# Patient Record
Sex: Male | Born: 1955 | Race: Asian | Hispanic: No | Marital: Married | Smoking: Never smoker
Health system: Southern US, Community
[De-identification: ages and names within clinical notes are randomized; demographics above are authoritative.]

## PROBLEM LIST (undated history)

## (undated) HISTORY — PX: HERNIA REPAIR: SHX51

---

## 2007-02-16 ENCOUNTER — Ambulatory Visit: Payer: Self-pay | Admitting: Internal Medicine

## 2015-04-23 ENCOUNTER — Ambulatory Visit
Admission: EM | Admit: 2015-04-23 | Discharge: 2015-04-23 | Disposition: A | Discharge status: Home / Self Care | Payer: BC Managed Care – PPO | Attending: Family Medicine | Admitting: Family Medicine

## 2015-04-23 ENCOUNTER — Encounter: Payer: Self-pay | Admitting: *Deleted

## 2015-04-23 DIAGNOSIS — J0100 Acute maxillary sinusitis, unspecified: Secondary | ICD-10-CM | POA: Diagnosis not present

## 2015-04-23 LAB — RAPID INFLUENZA A&B ANTIGENS
Influenza A (ARMC): NEGATIVE
Influenza B (ARMC): NEGATIVE

## 2015-04-23 LAB — RAPID STREP SCREEN (MED CTR MEBANE ONLY): Streptococcus, Group A Screen (Direct): NEGATIVE

## 2015-04-23 MED ORDER — AMOXICILLIN 875 MG PO TABS: 875.0000 mg | ORAL_TABLET | Freq: Two times a day (BID) | ORAL | Status: DC

## 2015-04-23 MED ORDER — GUAIFENESIN-CODEINE 100-10 MG/5ML PO SOLN: ORAL | Status: DC

## 2015-04-23 NOTE — ED Provider Notes (Signed)
CSN: [id number]     Arrival date & time 04/23/15  0831 History   First MD Initiated Contact with Patient 04/23/15 [id number]     Chief Complaint  Patient presents with  . Cough  . Fever  . Sore Throat  . Headache  . Generalized Body Aches   (Consider location/radiation/quality/duration/timing/severity/associated sxs/prior Treatment) Patient is a 60 y.o. male presenting with URI. The history is provided by the patient.  URI Presenting symptoms: congestion, cough, facial pain, fatigue, fever and sore throat   Severity:  Moderate Onset quality:  Sudden Duration:  1 week Timing:  Constant Progression:  Worsening Chronicity:  New Relieved by:  Nothing Ineffective treatments:  OTC medications Associated symptoms: headaches and sinus pain   Associated symptoms: no wheezing   Risk factors: sick contacts   Risk factors: not elderly, no chronic cardiac disease, no chronic kidney disease, no chronic respiratory disease, no diabetes mellitus, no immunosuppression, no recent illness and no recent travel     History reviewed. No pertinent past medical history. History reviewed. No pertinent past surgical history. History reviewed. No pertinent family history. Social History  Substance Use Topics  . Smoking status: Never Smoker   . Smokeless tobacco: None  . Alcohol Use: No    Review of Systems  Constitutional: Positive for fever and fatigue.  HENT: Positive for congestion and sore throat.   Respiratory: Positive for cough. Negative for wheezing.   Neurological: Positive for headaches.    Allergies  Review of patient's allergies indicates no known allergies.  Home Medications   Prior to Admission medications   Medication Sig Start Date End Date Taking? Authorizing Provider  aspirin 325 MG tablet Take 325 mg by mouth daily.   Yes Historical Provider, MD  guaiFENesin (MUCINEX) 600 MG 12 hr tablet Take by mouth 2 (two) times daily.   Yes Historical Provider, MD  amoxicillin (AMOXIL) 875  MG tablet Take 1 tablet (875 mg total) by mouth 2 (two) times daily. 04/23/15   Payton Mccallum, MD  guaiFENesin-codeine 100-10 MG/5ML syrup 5-10 ml po qhs prn cough 04/23/15   Payton Mccallum, MD   Meds Ordered and Administered this Visit  Medications - No data to display  BP 112/70 mmHg  Pulse 74  Temp(Src) 98.2 F (36.8 C) (Oral)  Resp 16  Ht  (1.676 m)  Wt 150 lb (68.04 kg)  BMI 24.22 kg/m2  SpO2 96% No data found.   Physical Exam  Constitutional: He appears well-developed and well-nourished. No distress.  HENT:  Head: Normocephalic and atraumatic.  Right Ear: Tympanic membrane, external ear and ear canal normal.  Left Ear: Tympanic membrane, external ear and ear canal normal.  Nose: Right sinus exhibits maxillary sinus tenderness and frontal sinus tenderness. Left sinus exhibits maxillary sinus tenderness and frontal sinus tenderness.  Mouth/Throat: Uvula is midline, oropharynx is clear and moist and mucous membranes are normal. No oropharyngeal exudate or tonsillar abscesses.  Eyes: Conjunctivae and EOM are normal. Pupils are equal, round, and reactive to light. Right eye exhibits no discharge. Left eye exhibits no discharge. No scleral icterus.  Neck: Normal range of motion. Neck supple. No tracheal deviation present. No thyromegaly present.  Cardiovascular: Normal rate, regular rhythm and normal heart sounds.   Pulmonary/Chest: Effort normal and breath sounds normal. No stridor. No respiratory distress. He has no wheezes. He has no rales. He exhibits no tenderness.  Lymphadenopathy:    He has no cervical adenopathy.  Neurological: He is alert.  Skin: Skin is warm  and dry. No rash noted. He is not diaphoretic.  Nursing note and vitals reviewed.   ED Course  Procedures (including critical care time)  Labs Review Labs Reviewed  RAPID INFLUENZA A&B ANTIGENS ([facility] ONLY)  RAPID STREP SCREEN (NOT [facility])  CULTURE, GROUP A STREP [facility])    Imaging Review No results  found.   Visual Acuity Review  Right Eye Distance:   Left Eye Distance:   Bilateral Distance:    Right Eye Near:   Left Eye Near:    Bilateral Near:         MDM   1. Acute maxillary sinusitis, recurrence not specified    New Prescriptions   AMOXICILLIN (AMOXIL) 875 MG TABLET    Take 1 tablet (875 mg total) by mouth 2 (two) times daily.   GUAIFENESIN-CODEINE 100-10 MG/5ML SYRUP    5-10 ml po qhs prn cough   1. Lab results and diagnosis reviewed with patient 2. rx as per orders above; reviewed possible side effects, interactions, risks and benefits  3. Recommend supportive treatment with increased fluids, otc flonase 4. Follow-up prn if symptoms worsen or don't improve    Payton Mccallumrlando Josmar Messimer, MD 04/23/15 [phone]

## 2015-04-23 NOTE — ED Notes (Signed)
Non-productive cough, fever, sore throat, headache, and body aches x1 month.

## 2015-04-25 LAB — CULTURE, GROUP A STREP (THRC)

## 2015-06-18 ENCOUNTER — Ambulatory Visit
Admission: EM | Admit: 2015-06-18 | Discharge: 2015-06-18 | Disposition: A | Discharge status: Home / Self Care | Payer: BC Managed Care – PPO | Attending: Family Medicine | Admitting: Family Medicine

## 2015-06-18 ENCOUNTER — Encounter: Payer: Self-pay | Admitting: *Deleted

## 2015-06-18 DIAGNOSIS — A09 Infectious gastroenteritis and colitis, unspecified: Secondary | ICD-10-CM

## 2015-06-18 DIAGNOSIS — R197 Diarrhea, unspecified: Secondary | ICD-10-CM

## 2015-06-18 LAB — CBC WITH DIFFERENTIAL/PLATELET
Basophils Absolute: 0 10*3/uL (ref 0–0.1)
Basophils Relative: 0 %
Eosinophils Absolute: 0.2 10*3/uL (ref 0–0.7)
Eosinophils Relative: 2 %
HCT: 38.1 % — ABNORMAL LOW (ref 40.0–52.0)
Hemoglobin: 12.5 g/dL — ABNORMAL LOW (ref 13.0–18.0)
Lymphocytes Relative: 11 %
Lymphs Abs: 1.1 10*3/uL (ref 1.0–3.6)
MCH: 27.2 pg (ref 26.0–34.0)
MCHC: 32.7 g/dL (ref 32.0–36.0)
MCV: 83.1 fL (ref 80.0–100.0)
Monocytes Absolute: 0.5 10*3/uL (ref 0.2–1.0)
Monocytes Relative: 5 %
Neutro Abs: 8.1 10*3/uL — ABNORMAL HIGH (ref 1.4–6.5)
Neutrophils Relative %: 82 %
Platelets: 355 10*3/uL (ref 150–440)
RBC: 4.59 MIL/uL (ref 4.40–5.90)
RDW: 14.6 % — ABNORMAL HIGH (ref 11.5–14.5)
WBC: 9.8 10*3/uL (ref 3.8–10.6)

## 2015-06-18 LAB — COMPREHENSIVE METABOLIC PANEL
ALT: 17 U/L (ref 17–63)
AST: 23 U/L (ref 15–41)
Albumin: 4 g/dL (ref 3.5–5.0)
Alkaline Phosphatase: 91 U/L (ref 38–126)
Anion gap: 10 (ref 5–15)
BUN: 19 mg/dL (ref 6–20)
CO2: 24 mmol/L (ref 22–32)
Calcium: 8.8 mg/dL — ABNORMAL LOW (ref 8.9–10.3)
Chloride: 105 mmol/L (ref 101–111)
Creatinine, Ser: 1.09 mg/dL (ref 0.61–1.24)
GFR calc Af Amer: >60 mL/min (ref >60)
GFR calc non Af Amer: >60 mL/min (ref >60)
Glucose, Bld: 99 mg/dL (ref 65–99)
Potassium: 3.2 mmol/L — ABNORMAL LOW (ref 3.5–5.1)
Sodium: 139 mmol/L (ref 135–145)
Total Bilirubin: 1.2 mg/dL (ref 0.3–1.2)
Total Protein: 7.9 g/dL (ref 6.5–8.1)

## 2015-06-18 MED ORDER — CIPROFLOXACIN HCL 500 MG PO TABS: 500.0000 mg | ORAL_TABLET | Freq: Two times a day (BID) | ORAL | Status: DC

## 2015-06-18 MED ORDER — ONDANSETRON 8 MG PO TBDP: 8.0000 mg | ORAL_TABLET | Freq: Two times a day (BID) | ORAL | Status: DC

## 2015-06-18 NOTE — ED Notes (Signed)
Pt feels he ate questionable food last Saturday and since has had persistent N/V/D and cannot "keep anything down". Also non-productive cough.

## 2015-06-18 NOTE — Discharge Instructions (Signed)
Diarrhea Diarrhea is frequent loose and watery bowel movements. It can cause you to feel weak and dehydrated. Dehydration can cause you to become tired and thirsty, have a dry mouth, and have decreased urination that often is dark yellow. Diarrhea is a sign of another problem, most often an infection that will not last long. In most cases, diarrhea typically lasts 2-3 days. However, it can last longer if it is a sign of something more serious. It is important to treat your diarrhea as directed by your caregiver to lessen or prevent future episodes of diarrhea. CAUSES  Some common causes include:  Gastrointestinal infections caused by viruses, bacteria, or parasites.  Food poisoning or food allergies.  Certain medicines, such as antibiotics, chemotherapy, and laxatives.  Artificial sweeteners and fructose.  Digestive disorders. HOME CARE INSTRUCTIONS  Ensure adequate fluid intake (hydration): Have 1 cup (8 oz) of fluid for each diarrhea episode. Avoid fluids that contain simple sugars or sports drinks, fruit juices, whole milk products, and sodas. Your urine should be clear or pale yellow if you are drinking enough fluids. Hydrate with an oral rehydration solution that you can purchase at pharmacies, retail stores, and online. You can prepare an oral rehydration solution at home by mixing the following ingredients together:   - tsp table salt.   tsp baking soda.   tsp salt substitute containing potassium chloride.  1  tablespoons sugar.  1 L (34 oz) of water.  Certain foods and beverages may increase the speed at which food moves through the gastrointestinal (GI) tract. These foods and beverages should be avoided and include:  Caffeinated and alcoholic beverages.  High-fiber foods, such as raw fruits and vegetables, nuts, seeds, and whole grain breads and cereals.  Foods and beverages sweetened with sugar alcohols, such as xylitol, sorbitol, and mannitol.  Some foods may be well  tolerated and may help thicken stool including:  Starchy foods, such as rice, toast, pasta, low-sugar cereal, oatmeal, grits, baked potatoes, crackers, and bagels.  Bananas.  Applesauce.  Add probiotic-rich foods to help increase healthy bacteria in the GI tract, such as yogurt and fermented milk products.  Wash your hands well after each diarrhea episode.  Only take over-the-counter or prescription medicines as directed by your caregiver.  Take a warm bath to relieve any burning or pain from frequent diarrhea episodes. SEEK IMMEDIATE MEDICAL CARE IF:   You are unable to keep fluids down.  You have persistent vomiting.  You have blood in your stool, or your stools are black and tarry.  You do not urinate in 6-8 hours, or there is only a small amount of very dark urine.  You have abdominal pain that increases or localizes.  You have weakness, dizziness, confusion, or light-headedness.  You have a severe headache.  Your diarrhea gets worse or does not get better.  You have a fever or persistent symptoms for more than 2-3 days.  You have a fever and your symptoms suddenly get worse. MAKE SURE YOU:   Understand these instructions.  Will watch your condition.  Will get help right away if you are not doing well or get worse.   This information is not intended to replace advice given to you by your health care provider. Make sure you discuss any questions you have with your health care provider.   Document Released: 12/23/2001 Document Revised: 01/23/2014 Document Reviewed: 09/10/2011 Elsevier Interactive Patient Education 2016 [employer]   Food Choices to Help Relieve Diarrhea, Adult When you have   diarrhea, the foods you eat and your eating habits are very important. Choosing the right foods and drinks can help relieve diarrhea. Also, because diarrhea can last up to 7 days, you need to replace lost fluids and electrolytes (such as sodium, potassium, and chloride) in  order to help prevent dehydration.  WHAT GENERAL GUIDELINES DO I NEED TO FOLLOW?  Slowly drink 1 cup (8 oz) of fluid for each episode of diarrhea. If you are getting enough fluid, your urine will be clear or pale yellow.  Eat starchy foods. Some good choices include white rice, white toast, pasta, low-fiber cereal, baked potatoes (without the skin), saltine crackers, and bagels.  Avoid large servings of any cooked vegetables.  Limit fruit to two servings per day. A serving is  cup or 1 small piece.  Choose foods with less than 2 g of fiber per serving.  Limit fats to less than 8 tsp (38 g) per day.  Avoid fried foods.  Eat foods that have probiotics in them. Probiotics can be found in certain dairy products.  Avoid foods and beverages that may increase the speed at which food moves through the stomach and intestines (gastrointestinal tract). Things to avoid include:  High-fiber foods, such as dried fruit, raw fruits and vegetables, nuts, seeds, and whole grain foods.  Spicy foods and high-fat foods.  Foods and beverages sweetened with high-fructose corn syrup, honey, or sugar alcohols such as xylitol, sorbitol, and mannitol. WHAT FOODS ARE RECOMMENDED? Grains White rice. White, French, or pita breads (fresh or toasted), including plain rolls, buns, or bagels. White pasta. Saltine, soda, or graham crackers. Pretzels. Low-fiber cereal. Cooked cereals made with water (such as cornmeal, farina, or cream cereals). Plain muffins. Matzo. Melba toast. Zwieback.  Vegetables Potatoes (without the skin). Strained tomato and vegetable juices. Most well-cooked and canned vegetables without seeds. Tender lettuce. Fruits Cooked or canned applesauce, apricots, cherries, fruit cocktail, grapefruit, peaches, pears, or plums. Fresh bananas, apples without skin, cherries, grapes, cantaloupe, grapefruit, peaches, oranges, or plums.  Meat and Other Protein Products Baked or boiled chicken. Eggs. Tofu.  Fish. Seafood. Smooth peanut butter. Ground or well-cooked tender beef, ham, veal, lamb, pork, or poultry.  Dairy Plain yogurt, kefir, and unsweetened liquid yogurt. Lactose-free milk, buttermilk, or soy milk. Plain hard cheese. Beverages Sport drinks. Clear broths. Diluted fruit juices (except prune). Regular, caffeine-free sodas such as ginger ale. Water. Decaffeinated teas. Oral rehydration solutions. Sugar-free beverages not sweetened with sugar alcohols. Other Bouillon, broth, or soups made from recommended foods.  The items listed above may not be a complete list of recommended foods or beverages. Contact your dietitian for more options. WHAT FOODS ARE NOT RECOMMENDED? Grains Whole grain, whole wheat, bran, or rye breads, rolls, pastas, crackers, and cereals. Wild or brown rice. Cereals that contain more than 2 g of fiber per serving. Corn tortillas or taco shells. Cooked or dry oatmeal. Granola. Popcorn. Vegetables Raw vegetables. Cabbage, broccoli, Brussels sprouts, artichokes, baked beans, beet greens, corn, kale, legumes, peas, sweet potatoes, and yams. Potato skins. Cooked spinach and cabbage. Fruits Dried fruit, including raisins and dates. Raw fruits. Stewed or dried prunes. Fresh apples with skin, apricots, mangoes, pears, raspberries, and strawberries.  Meat and Other Protein Products Chunky peanut butter. Nuts and seeds. Beans and lentils. Bacon.  Dairy High-fat cheeses. Milk, chocolate milk, and beverages made with milk, such as milk shakes. Cream. Ice cream. Sweets and Desserts Sweet rolls, doughnuts, and sweet breads. Pancakes and waffles. Fats and Oils Butter. Cream sauces.   Margarine. Salad oils. Plain salad dressings. Olives. Avocados.  Beverages Caffeinated beverages (such as coffee, tea, soda, or energy drinks). Alcoholic beverages. Fruit juices with pulp. Prune juice. Soft drinks sweetened with high-fructose corn syrup or sugar alcohols. Other Coconut. Hot sauce.  Chili powder. Mayonnaise. Gravy. Cream-based or milk-based soups.  The items listed above may not be a complete list of foods and beverages to avoid. Contact your dietitian for more information. WHAT SHOULD I DO IF I BECOME DEHYDRATED? Diarrhea can sometimes lead to dehydration. Signs of dehydration include dark urine and dry mouth and skin. If you think you are dehydrated, you should rehydrate with an oral rehydration solution. These solutions can be purchased at pharmacies, retail stores, or online.  Drink -1 cup (120-240 mL) of oral rehydration solution each time you have an episode of diarrhea. If drinking this amount makes your diarrhea worse, try drinking smaller amounts more often. For example, drink 1-3 tsp (5-15 mL) every 5-10 minutes.  A general rule for staying hydrated is to drink 1-2 L of fluid per day. Talk to your health care provider about the specific amount you should be drinking each day. Drink enough fluids to keep your urine clear or pale yellow.   This information is not intended to replace advice given to you by your health care provider. Make sure you discuss any questions you have with your health care provider.   Document Released: 03/25/2003 Document Revised: 01/23/2014 Document Reviewed: 11/25/2012 Elsevier Interactive Patient Education 2016 Elsevier Inc.   

## 2015-06-18 NOTE — ED Provider Notes (Signed)
CSN: [id number]     Arrival date & time 06/18/15  1853 History   First MD Initiated Contact with Patient 06/18/15 2119     Chief Complaint  Patient presents with  . Diarrhea  . Nausea  . Emesis  . Cough   (Consider location/radiation/quality/duration/timing/severity/associated sxs/prior Treatment) HPI  This is a 60 year old male who presents with persistent diarrhea for 6 days. He has had nausea and vomiting and states that he is unable to even keep water down. When questioned now he does not have nausea. Is a nonproductive cough that is had for a while and may be related to GERD because it is improved when he sleeps in a semierect a reclining position. He states that he has no blood or mucus in the stools are extremely watery without form. He is having between 5-6 per day. He will occasionally have cramping but the stomach pain is not a predominant feature. He has been using Imodium r at first which did not work and switched to Pepto-Bismol which had slowed it down somewhat but has failed to help recently.He denies any fever or chills. He was concerned that he had a salad which may have precipitated his symptoms but he is unsure.      History reviewed. No pertinent past medical history. History reviewed. No pertinent past surgical history. History reviewed. No pertinent family history. Social History  Substance Use Topics  . Smoking status: Never Smoker   . Smokeless tobacco: None  . Alcohol Use: No    Review of Systems  Constitutional: Positive for appetite change. Negative for fever, chills and fatigue.  Gastrointestinal: Positive for nausea, vomiting, abdominal pain and diarrhea. Negative for blood in stool, abdominal distention and anal bleeding.  All other systems reviewed and are negative.   Allergies  Review of patient's allergies indicates no known allergies.  Home Medications   Prior to Admission medications   Medication Sig Start Date End Date Taking? Authorizing  Provider  aspirin 325 MG tablet Take 325 mg by mouth daily.   Yes Historical Provider, MD  pantoprazole (PROTONIX) 20 MG tablet Take 20 mg by mouth daily.   Yes Historical Provider, MD  amoxicillin (AMOXIL) 875 MG tablet Take 1 tablet (875 mg total) by mouth 2 (two) times daily. 04/23/15   Norval Gable, MD  ciprofloxacin (CIPRO) 500 MG tablet Take 1 tablet (500 mg total) by mouth 2 (two) times daily. 06/18/15   Lorin Picket, PA-C  guaiFENesin (MUCINEX) 600 MG 12 hr tablet Take by mouth 2 (two) times daily.    Historical Provider, MD  guaiFENesin-codeine 100-10 MG/5ML syrup 5-10 ml po qhs prn cough 04/23/15   Norval Gable, MD  ondansetron (ZOFRAN ODT) 8 MG disintegrating tablet Take 1 tablet (8 mg total) by mouth 2 (two) times daily. 06/18/15   Lorin Picket, PA-C   Meds Ordered and Administered this Visit  Medications - No data to display  BP 114/72 mmHg  Pulse 86  Temp(Src) 98.8 F (37.1 C) (Oral)  Resp 16  Ht '5\' 6"'  (1.676 m)  Wt 145 lb (65.772 kg)  BMI 23.41 kg/m2  SpO2 95% No data found.   Physical Exam  ED Course  Procedures (including critical care time)  Labs Review Labs Reviewed  CBC WITH DIFFERENTIAL/PLATELET - Abnormal; Notable for the following:    Hemoglobin 12.5 (*)    HCT 38.1 (*)    RDW 14.6 (*)    Neutro Abs 8.1 (*)    All other components within  normal limits  COMPREHENSIVE METABOLIC PANEL - Abnormal; Notable for the following:    Potassium 3.2 (*)    Calcium 8.8 (*)    All other components within normal limits    Imaging Review No results found.   Visual Acuity Review  Right Eye Distance:   Left Eye Distance:   Bilateral Distance:    Right Eye Near:   Left Eye Near:    Bilateral Near:         MDM   1. Diarrhea of presumed infectious origin   Medications - No data to display Plan: 1. Test/x-ray results and diagnosis reviewed with patient 2. rx as per orders; risks, benefits, potential side effects reviewed with patient 3. Recommend  supportive treatment with Hydration. I recommended broth as a part of his hydration program. I've given him a stool sample kit to collect stool for GI PCR repleted he will return in the morning. In the meantime I will start him empirically on Cipro 500 mg twice a day as he has had the constant diarrhea for a week pending the results of the PCR. Also provide him with Zofran ODT to help with any nausea so that he can continue with his hydration. If he is unable to hydrate adequately he may have to have IV hydration. If he worsens or is not improving he should be seen at the emergency room. I would like him to follow-up with his primary care physician next week 4. F/u prn if symptoms worsen or don't improve    Lorin Picket, PA-C 06/18/15 2242

## 2015-06-19 ENCOUNTER — Other Ambulatory Visit
Admission: RE | Admit: 2015-06-19 | Discharge: 2015-06-19 | Disposition: A | Discharge status: Home / Self Care | Payer: BC Managed Care – PPO | Source: Ambulatory Visit | Attending: Family Medicine | Admitting: Family Medicine

## 2015-06-19 DIAGNOSIS — R197 Diarrhea, unspecified: Secondary | ICD-10-CM | POA: Insufficient documentation

## 2015-06-20 LAB — GASTROINTESTINAL PANEL BY PCR, STOOL (REPLACES STOOL CULTURE)
Adenovirus F40/41: DETECTED — AB
Astrovirus: NOT DETECTED
Campylobacter species: NOT DETECTED
Cryptosporidium: NOT DETECTED
Cyclospora cayetanensis: NOT DETECTED
E. coli O157: NOT DETECTED
Entamoeba histolytica: NOT DETECTED
Enteroaggregative E coli (EAEC): NOT DETECTED
Enteropathogenic E coli (EPEC): NOT DETECTED
Enterotoxigenic E coli (ETEC): NOT DETECTED
Giardia lamblia: NOT DETECTED
Norovirus GI/GII: NOT DETECTED
Plesimonas shigelloides: NOT DETECTED
Rotavirus A: NOT DETECTED
Salmonella species: NOT DETECTED
Sapovirus (I, II, IV, and V): NOT DETECTED
Shiga like toxin producing E coli (STEC): NOT DETECTED
Shigella/Enteroinvasive E coli (EIEC): NOT DETECTED
Vibrio cholerae: NOT DETECTED
Vibrio species: NOT DETECTED
Yersinia enterocolitica: NOT DETECTED

## 2015-07-23 ENCOUNTER — Ambulatory Visit
Admission: RE | Admit: 2015-07-23 | Discharge: 2015-07-23 | Disposition: A | Discharge status: Home / Self Care | Payer: BC Managed Care – PPO | Source: Ambulatory Visit | Attending: Unknown Physician Specialty | Admitting: Unknown Physician Specialty

## 2015-07-23 ENCOUNTER — Other Ambulatory Visit: Payer: Self-pay | Admitting: Unknown Physician Specialty

## 2015-07-23 DIAGNOSIS — R05 Cough: Secondary | ICD-10-CM | POA: Diagnosis not present

## 2015-07-23 DIAGNOSIS — R059 Cough, unspecified: Secondary | ICD-10-CM

## 2015-07-23 DIAGNOSIS — R918 Other nonspecific abnormal finding of lung field: Secondary | ICD-10-CM | POA: Diagnosis not present

## 2015-07-27 ENCOUNTER — Other Ambulatory Visit: Payer: Self-pay | Admitting: Unknown Physician Specialty

## 2015-07-27 DIAGNOSIS — R05 Cough: Secondary | ICD-10-CM

## 2015-07-27 DIAGNOSIS — R059 Cough, unspecified: Secondary | ICD-10-CM

## 2015-08-02 ENCOUNTER — Ambulatory Visit: Payer: BC Managed Care – PPO | Attending: Unknown Physician Specialty

## 2015-08-10 ENCOUNTER — Ambulatory Visit (INDEPENDENT_AMBULATORY_CARE_PROVIDER_SITE_OTHER): Payer: BC Managed Care – PPO | Admitting: Internal Medicine

## 2015-08-10 ENCOUNTER — Encounter: Payer: Self-pay | Admitting: Internal Medicine

## 2015-08-10 ENCOUNTER — Encounter (INDEPENDENT_AMBULATORY_CARE_PROVIDER_SITE_OTHER): Payer: Self-pay

## 2015-08-10 VITALS — BP 118/64 | HR 79 | Ht 67.0 in | Wt 150.4 lb

## 2015-08-10 DIAGNOSIS — R053 Chronic cough: Secondary | ICD-10-CM | POA: Insufficient documentation

## 2015-08-10 DIAGNOSIS — R938 Abnormal findings on diagnostic imaging of other specified body structures: Secondary | ICD-10-CM

## 2015-08-10 DIAGNOSIS — R9389 Abnormal findings on diagnostic imaging of other specified body structures: Secondary | ICD-10-CM | POA: Insufficient documentation

## 2015-08-10 DIAGNOSIS — R918 Other nonspecific abnormal finding of lung field: Secondary | ICD-10-CM | POA: Insufficient documentation

## 2015-08-10 DIAGNOSIS — R05 Cough: Secondary | ICD-10-CM | POA: Diagnosis not present

## 2015-08-10 DIAGNOSIS — J984 Other disorders of lung: Secondary | ICD-10-CM

## 2015-08-10 MED ORDER — FLUTICASONE FUROATE 100 MCG/ACT IN AEPB: 1.0000 | INHALATION_SPRAY | Freq: Every day | RESPIRATORY_TRACT | 0 refills | Status: AC

## 2015-08-10 NOTE — Patient Instructions (Addendum)
Follow up with Dr. Dema Severin in:4-6 weeks - PFTs and 6 MWT prior to follow up visit.  - Arnuity - 2 wk trial, 1 puff daily, gargle and rinse after each use - if you have improvement with this call us back, and we will give a prescription. - CT Chest with contrast - chronic cough, pulmonary scarring.

## 2015-08-10 NOTE — Assessment & Plan Note (Signed)
Chest x-ray with ill-defined opacities within the upper lobes bilaterally, right greater than left, areas of chronic scarring and perihilar retraction. These early signs of emphysematous type COPD, although patient is a nonsmoker, he was exposed to wood-burning fuel for a number of years during his younger age. Further cracked resolution of these pulmonary opacities would be best with a CT chest with contrast  Plan: -CT chest with contrast prior to follow-up visit

## 2015-08-10 NOTE — Progress Notes (Signed)
[facility] Pulmonary Medicine Consultation    Date: 08/10/2015  MRN# [id number] Harold Villanueva 29-Dec-1955  Referring Physician: Dr. Carollee Massed is a 60 y.o. old male seen in consultation for chronic cough  CC:  Chief Complaint  Patient presents with  . pulmonary consult    per Dr. Jenne Campus for cough. c/o non prod cough. occ prod w/green mucus while showering X1y.    HPI:  Patient is a pleasant 60 year old male with a chief complain of cough times 1-2 years. He was referred by ENT after having a negative ENT workup to include a trial of antibiotics, proton pump inhibitors, CT neck, and normal laryngoscopy. Patient states cough within frequent maybe 1 to times a day over the past one year, described as a chest congestion or clearing of the throat. At times it may be mildly productive of light green sputum. No cough at night, cough is usually worse in the morning, and in summer. He had a normal sinus CT. Cough came on gradually. Patient stated that he worked in [place] around 2000 for a total of 6 weeks, there is a lot of smog during that time and after returning did have cough for a number of days and was seen at a [facility]. 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 He is a never smoker, currently a Production designer, theatre/television/film for tocolytic medications and near, no exotic birds at home, no pets. 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 Further ENT evaluation also included a chest x-ray that showed scarring of the apices. Patient was then referred for further pulmonary evaluation.   PMHX:   No past medical history on file. Surgical Hx:  No past surgical history on file. Family Hx:  Family History  Problem Relation Age of Onset  . Heart disease Father   . Lung cancer Father    Social Hx:   Social History  Substance  Use Topics  . Smoking status: Never Smoker  . Smokeless tobacco: Never Used  . Alcohol use No   Medication:   Current Outpatient Rx  . Order #: [id number] Class: Historical Med  . Order #: [id number] Class: Historical Med  . Order #: [id number] Class: Sample      Allergies:  Review of patient's allergies indicates no known allergies.  Review of Systems  Constitutional: Negative for chills and fever.  HENT: Positive for hearing loss.   Eyes: Negative for blurred vision.  Respiratory: Positive for cough and sputum production. Negative for hemoptysis, shortness of breath and wheezing.   Cardiovascular: Negative for chest pain.  Gastrointestinal: Negative for heartburn, nausea and vomiting.  Genitourinary: Negative for dysuria.  Musculoskeletal: Negative for myalgias.  Skin: Negative for itching and rash.  Neurological: Negative for dizziness.  Endo/Heme/Allergies: Does not bruise/bleed easily.  Psychiatric/Behavioral: Negative for depression.     Physical Examination:   VS: BP 118/64 (BP Location: Left Arm, Cuff Size: Normal)   Pulse 79   Ht  (1.702 m)   Wt 150 lb 6.4 oz (68.2 kg)   SpO2 97%   BMI 23.56 kg/m   General Appearance: No distress  Neuro:without focal findings, mental status, speech normal, alert and oriented, cranial nerves 2-12 intact, reflexes normal and symmetric, sensation grossly normal  HEENT: PERRLA, EOM intact, no ptosis, no other lesions noticed; Mallampati 2 Pulmonary: normal breath sounds., diaphragmatic  excursion normal.No wheezing, No rales;   Sputum Production:  none CardiovascularNormal S1,S2.  No m/r/g.  Abdominal aorta pulsation normal.    Abdomen: Benign, Soft, non-tender, No masses, hepatosplenomegaly, No lymphadenopathy Renal:  No costovertebral tenderness  GU:  No performed at this time. Endoc: No evident thyromegaly, no signs of acromegaly or Cushing features Skin:   warm, no rashes, no ecchymosis  Extremities: normal, no cyanosis,  clubbing, no edema, warm with normal capillary refill. Other findings:none   Rad results: (The following images and results were reviewed by Dr. Dema Severin on 08/10/2015). CXR 07/23/2015 CLINICAL DATA:  Cough for 1 year.  EXAM: CHEST  2 VIEW  COMPARISON:  None.  FINDINGS: Patchy ill-defined airspace opacities are seen within the upper lobes bilaterally, right greater than left. There appears to be associated perihilar retraction towards the upper lobes suggesting chronic scarring or fibrosis. Lung bases are relatively clear. No pleural effusion or pneumothorax seen.  Heart size is normal. Osseous structures about the chest are unremarkable.  IMPRESSION: Ill-defined opacities within the upper lobes bilaterally, right greater than left. I suspect these are areas of chronic scarring/fibrosis causing underlying perihilar retraction. Superimposed pneumonia or neoplastic process not excluded. Recommend chest CT for definitive characterization.  These results will be called to the ordering clinician or representative by the Radiologist Assistant, and communication documented in the PACS or zVision Dashboard.   EKG:     Other:   Assessment and Plan: 60 year old male seen in consultation for chronic cough Chronic cough Given history along with symptoms and imaging findings, patient may have early COPD, especially with scarring of his lung apices. Although he is a never smoker, being exposed to wood burning heating materials for about 15-20 years does put him at risk for COPD, especially at this stage in life. Further evaluation with chest CT and pulmonary function testing along with 6 minute walk test.  Plan: -Pulmonary function testing and 6 minute walk test -Trial of inhaled corticosteroids, Rudy 100 g-two-week trial, 1 puff daily, rinse after each use, if there is improvement, a prescription will be given -CT chest with contrast  Abnormal chest x-ray Chest x-ray with  ill-defined opacities within the upper lobes bilaterally, right greater than left, areas of chronic scarring and perihilar retraction. These early signs of emphysematous type COPD, although patient is a nonsmoker, he was exposed to wood-burning fuel for a number of years during his younger age. Further cracked resolution of these pulmonary opacities would be best with a CT chest with contrast  Plan: -CT chest with contrast prior to follow-up visit  Opacity of lung on imaging study Ill-defined apical opacities on chest x-ray. These are associated with some scarring and perihilar retraction. 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 Further characterization and evaluation of these opacities with CT scan of the chest along with pulmonary function testing is necessary   Updated Medication List Outpatient Encounter Prescriptions as of 08/10/2015  Medication Sig  . aspirin 325 MG tablet Take 325 mg by mouth daily.  Marland Kitchen guaiFENesin (MUCINEX) 600 MG 12 hr tablet Take by mouth as needed.   . [DISCONTINUED] ciprofloxacin (CIPRO) 500 MG tablet Take 1 tablet (500 mg total) by mouth 2 (two) times daily.  . [DISCONTINUED] guaiFENesin-codeine 100-10 MG/5ML syrup 5-10 ml po qhs prn cough  . [DISCONTINUED] ondansetron (ZOFRAN ODT) 8 MG disintegrating tablet Take 1 tablet (8 mg total) by mouth 2 (two) times daily.  . [DISCONTINUED] pantoprazole (PROTONIX) 20 MG tablet Take 20 mg  by mouth daily.  . Fluticasone Furoate (ARNUITY ELLIPTA) 100 MCG/ACT AEPB Inhale 1 puff into the lungs daily.  . [DISCONTINUED] amoxicillin (AMOXIL) 875 MG tablet Take 1 tablet (875 mg total) by mouth 2 (two) times daily. (Patient not taking: Reported on 08/10/2015)  . [DISCONTINUED] loratadine (CLARITIN) 10 MG tablet Take 10 mg by mouth daily.   No facility-administered encounter medications on file as of 08/10/2015.     Orders for this visit: Orders Placed This Encounter  Procedures  . CT CHEST W CONTRAST     Standing Status:   Future    Standing Expiration Date:   10/09/2016    Order Specific Question:   Reason for Exam (SYMPTOM  OR DIAGNOSIS REQUIRED)    Answer:   chronic cough    Order Specific Question:   Preferred imaging location?    Answer:   [facility]  . Pulmonary function test    Standing Status:   Future    Standing Expiration Date:   08/09/2016    Order Specific Question:   Where should this test be performed?    Answer:   [facility]    Order Specific Question:   Full PFT: includes the following: basic spirometry, spirometry pre & post bronchodilator, diffusion capacity (DLCO), lung volumes    Answer:   Full PFT  . 6 minute walk    Standing Status:   Future    Standing Expiration Date:   08/09/2016    Order Specific Question:   Where should this test be performed?    Answer:   Other     Thank  you for the consultation and for allowing [facility] to assist in the care of your patient. Our recommendations are noted above.  Please contact us if we can be of further service.   Stephanie Acre, MD [facility] Office Number: [phone]  Note: This note was prepared with Dragon dictation along with smaller phrase technology. Any transcriptional errors that result from this process are unintentional.

## 2015-08-10 NOTE — Assessment & Plan Note (Signed)
Ill-defined apical opacities on chest x-ray. These are associated with some scarring and perihilar retraction. 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 Further characterization and evaluation of these opacities with CT scan of the chest along with pulmonary function testing is necessary

## 2015-08-10 NOTE — Assessment & Plan Note (Signed)
Given history along with symptoms and imaging findings, patient may have early COPD, especially with scarring of his lung apices. Although he is a never smoker, being exposed to wood burning heating materials for about 15-20 years does put him at risk for COPD, especially at this stage in life. Further evaluation with chest CT and pulmonary function testing along with 6 minute walk test.  Plan: -Pulmonary function testing and 6 minute walk test -Trial of inhaled corticosteroids, Rudy 100 g-two-week trial, 1 puff daily, rinse after each use, if there is improvement, a prescription will be given -CT chest with contrast

## 2015-08-10 NOTE — Progress Notes (Signed)
Patient ID: Harold Villanueva, male   DOB: Jun 09, 1955, 60 y.o.   MRN: [id number] Patient seen in the office today and instructed on use of ARNUITY ELLIPTA.  Patient expressed understanding and demonstrated technique.

## 2015-08-16 ENCOUNTER — Telehealth: Payer: Self-pay | Admitting: Internal Medicine

## 2015-08-16 NOTE — Telephone Encounter (Signed)
Stop Arnuity. It was only a 1 wk trial.  We will see what the CT chest shows, and formulate a plan from that point.

## 2015-08-16 NOTE — Telephone Encounter (Signed)
Spoke with pt and he states he is having chest pressure like before starting the Arnuity and the cough has not went away. Informed pt not take take the Arnuity until I have called him back and if CP got worse or sharp pains to go to ER. Pt verbalized understanding.  VM please advise on Arnuity. Pt has taken it for 7 days so far. Thanks

## 2015-08-16 NOTE — Telephone Encounter (Signed)
Pt calling wanting to give [place] an update  He is on the new medication  Today is the 7th day Doing once a day 2/3rd day started to feel it working Just wanted to give [place] an update.    But now its got a light pain in his chest  Would like to make sure there is nothing wrong please advise.

## 2015-08-17 NOTE — Telephone Encounter (Signed)
Tried to call pt but no answer and VM was full. Will call back later.

## 2015-08-17 NOTE — Telephone Encounter (Signed)
Pt informed and agrees with plan. Nothing further needed.

## 2015-09-21 ENCOUNTER — Telehealth: Payer: Self-pay | Admitting: Internal Medicine

## 2015-09-21 ENCOUNTER — Other Ambulatory Visit: Payer: Self-pay | Admitting: *Deleted

## 2015-09-21 ENCOUNTER — Ambulatory Visit: Payer: BC Managed Care – PPO

## 2015-09-21 ENCOUNTER — Ambulatory Visit
Admission: RE | Admit: 2015-09-21 | Discharge: 2015-09-21 | Disposition: A | Discharge status: Home / Self Care | Payer: BC Managed Care – PPO | Source: Ambulatory Visit | Attending: Internal Medicine | Admitting: Internal Medicine

## 2015-09-21 ENCOUNTER — Ambulatory Visit (INDEPENDENT_AMBULATORY_CARE_PROVIDER_SITE_OTHER): Payer: BC Managed Care – PPO | Admitting: *Deleted

## 2015-09-21 ENCOUNTER — Telehealth: Payer: Self-pay | Admitting: *Deleted

## 2015-09-21 DIAGNOSIS — R05 Cough: Secondary | ICD-10-CM

## 2015-09-21 DIAGNOSIS — R053 Chronic cough: Secondary | ICD-10-CM

## 2015-09-21 DIAGNOSIS — J984 Other disorders of lung: Secondary | ICD-10-CM

## 2015-09-21 DIAGNOSIS — R918 Other nonspecific abnormal finding of lung field: Secondary | ICD-10-CM | POA: Insufficient documentation

## 2015-09-21 LAB — PULMONARY FUNCTION TEST
DL/VA % pred: 84 %
DL/VA: 3.74 ml/min/mmHg/L
DLCO UNC % PRED: 66 %
DLCO unc: 18.69 ml/min/mmHg
FEF 25-75 Post: 1.26 L/sec
FEF 25-75 Pre: 1.86 L/sec
FEF2575-%CHANGE-POST: -32 %
FEV1-%Change-Post: -12 %
FEV1-POST: 2.25 L
FEV1-PRE: 2.57 L
FEV1FVC-%Change-Post: 0 %
FEV6-%CHANGE-POST: -10 %
FEV6-POST: 3.28 L
FEV6-PRE: 3.66 L
FEV6FVC-%Change-Post: 0 %
FVC-%Change-Post: -11 %
FVC-PRE: 3.72 L
FVC-Post: 3.28 L
POST FEV1/FVC RATIO: 69 %
PRE FEV1/FVC RATIO: 69 %
Post FEV6/FVC ratio: 100 %
Pre FEV6/FVC Ratio: 99 %
RV % PRED: 114 %
RV: 2.39 L
TLC % PRED: 95 %
TLC: 6.1 L

## 2015-09-21 LAB — POCT I-STAT CREATININE: CREATININE: 1.1 mg/dL (ref 0.61–1.24)

## 2015-09-21 MED ORDER — IOPAMIDOL (ISOVUE-300) INJECTION 61%
75.0000 mL | Freq: Once | INTRAVENOUS | Status: AC | PRN
  Administered 2015-09-21: 75 mL via INTRAVENOUS

## 2015-09-21 NOTE — Progress Notes (Signed)
SMW performed today. 

## 2015-09-21 NOTE — Telephone Encounter (Signed)
VM called in regards to pt's CT chest results after talking to radiologist. Per VM place order for Quantiferon test for pt and inform pt he looked at CT and he has some lesions that are concerning and he wants him to get tested. Order placed.   Spoke with pt and informed him of response from VM and he is going today to get lab work drawn.  Nothing further needed.

## 2015-09-21 NOTE — Telephone Encounter (Signed)
[facility]riken [facility] would like to speak with Dr. Dema SeverinMungal regarding patient's CT that was performed today.  Please call Dr. Vinnie LevelEntriken at [phone]

## 2015-09-21 NOTE — Telephone Encounter (Signed)
VM is made aware via phone.  Nothing further needed.

## 2015-09-21 NOTE — Progress Notes (Signed)
PFT performed today with Nitrogen washout. 

## 2015-09-21 NOTE — Telephone Encounter (Signed)
Received a call from radiologist concerning patient Chest CT scan. 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  I have discussed this case with Dr. Sampson GoonFitzgerald who recommended the patient go directly to the health department for further workup. [place] office staff also notify patient of recommendations per [place] infectious disease physician, Dr. Sampson GoonFitzgerald. [facility] at 8 AM on 09/22/2015.

## 2015-09-24 ENCOUNTER — Ambulatory Visit: Payer: BC Managed Care – PPO

## 2015-09-27 ENCOUNTER — Ambulatory Visit: Payer: BC Managed Care – PPO | Admitting: Internal Medicine

## 2015-09-28 ENCOUNTER — Ambulatory Visit
Admission: RE | Admit: 2015-09-28 | Discharge: 2015-09-28 | Disposition: A | Discharge status: Home / Self Care | Payer: Self-pay | Source: Ambulatory Visit | Attending: Family Medicine | Admitting: Family Medicine

## 2015-09-28 ENCOUNTER — Other Ambulatory Visit (HOSPITAL_COMMUNITY): Payer: Self-pay | Admitting: Family Medicine

## 2015-09-28 DIAGNOSIS — A150 Tuberculosis of lung: Secondary | ICD-10-CM

## 2015-09-29 ENCOUNTER — Telehealth: Payer: Self-pay | Admitting: *Deleted

## 2015-09-29 NOTE — Telephone Encounter (Signed)
Received a call from Christy at the local Health Dept that called to inform that the pt has tested positive for TB. She states they got sputum cultures and the following were the results.  1 sputum was 4+ AFB positive 1 sputum was 3+ AFB positive PCR-MTB positive.  Pt is being treated and is on isolation until he has 2 negative sputums in the same week. She states they will check sputum cultures Q2weeks.  Pt does have up coming appt on 10/14/15.

## 2015-09-29 NOTE — Telephone Encounter (Signed)
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  Reschedule is appointment for early Dec. He can be seen earlier if the health department requires a bronch.  Thank you

## 2015-09-30 NOTE — Telephone Encounter (Signed)
Pt informed of appt cancellation due to being treated for TB at [facility]. Will put pt in for recall for December 2017.

## 2015-10-14 ENCOUNTER — Ambulatory Visit: Payer: BC Managed Care – PPO | Admitting: Internal Medicine

## 2016-03-11 ENCOUNTER — Ambulatory Visit
Admission: RE | Admit: 2016-03-11 | Discharge: 2016-03-11 | Disposition: A | Discharge status: Home / Self Care | Payer: BC Managed Care – PPO | Source: Ambulatory Visit | Attending: Family Medicine | Admitting: Family Medicine

## 2016-03-11 ENCOUNTER — Other Ambulatory Visit (HOSPITAL_COMMUNITY): Payer: Self-pay | Admitting: Family Medicine

## 2016-03-11 DIAGNOSIS — A159 Respiratory tuberculosis unspecified: Secondary | ICD-10-CM

## 2016-03-11 DIAGNOSIS — R918 Other nonspecific abnormal finding of lung field: Secondary | ICD-10-CM | POA: Insufficient documentation

## 2017-06-30 IMAGING — CT CT CHEST W/ CM
2 of 3 series · 15 of 36 positions shown, 18 images · IV contrast (iopamidol)
Comparison: None.

CLINICAL DATA: 60-year-old male with history of cough for the past
3 months. History of COPD. Sputum production.

EXAM:
CT CHEST WITH CONTRAST
TECHNIQUE: Multidetector CT imaging of the chest was performed during
intravenous contrast administration.
CONTRAST:  75mL [id number] IOPAMIDOL ([id number]) INJECTION 61%

[Series 2: axial st · axial · 0.74mm/px · z∈[-325,-59]mm · 12 of 157 slices shown, 15 images]
[im 12/157  mediastinal]
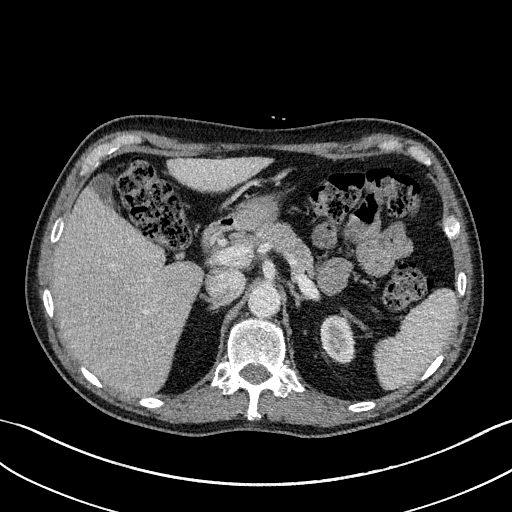
[im 12/157  lung]
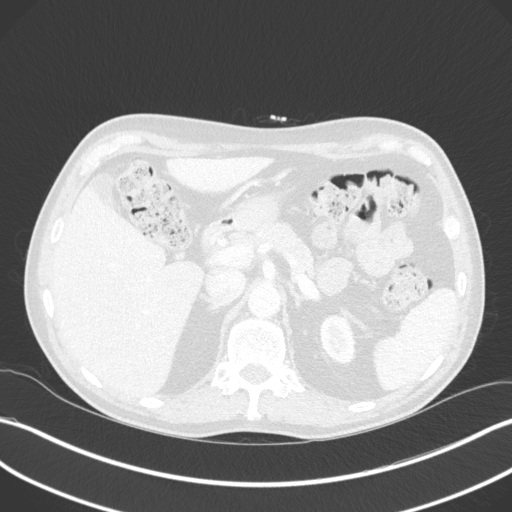
[im 24/157  lung]
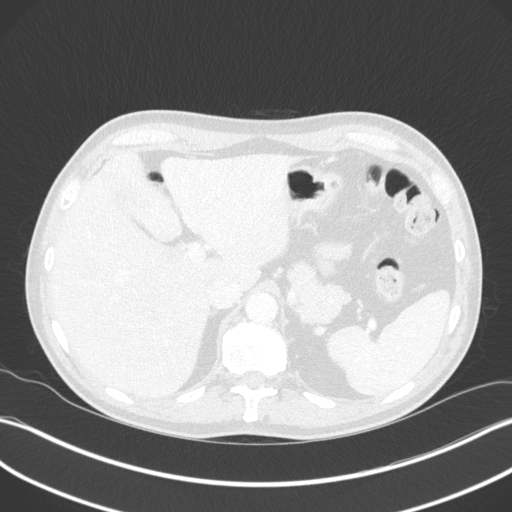
[im 35/157  lung]
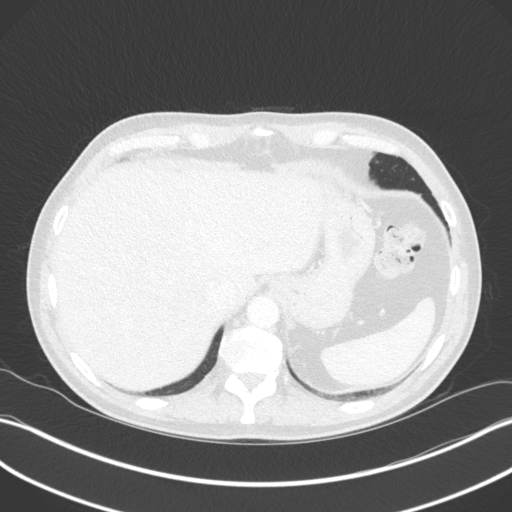
[im 47/157  lung]
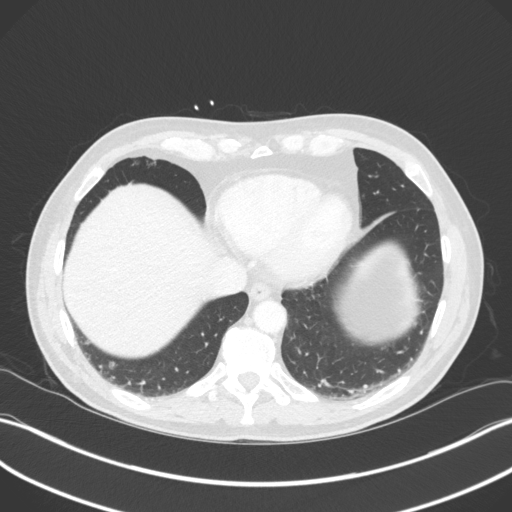
[im 58/157  mediastinal]
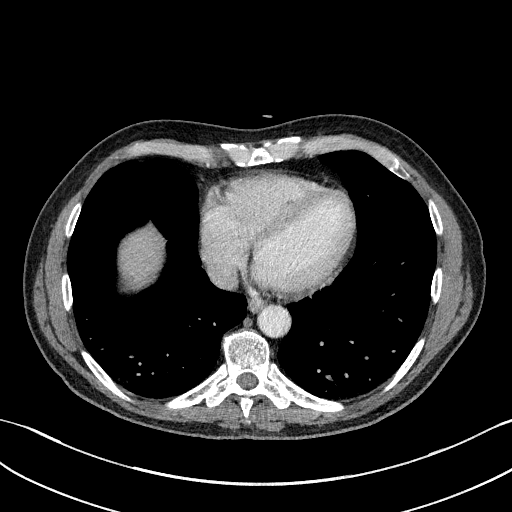
[im 58/157  lung]
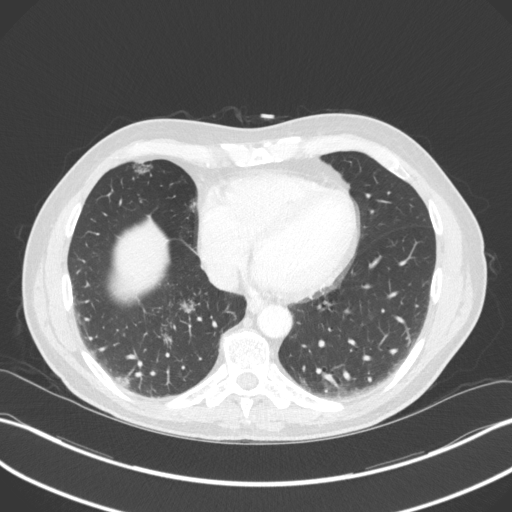
[im 70/157  lung]
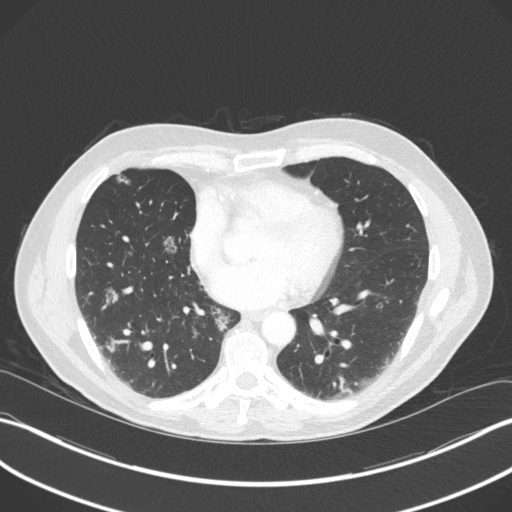
[im 87/157  lung]
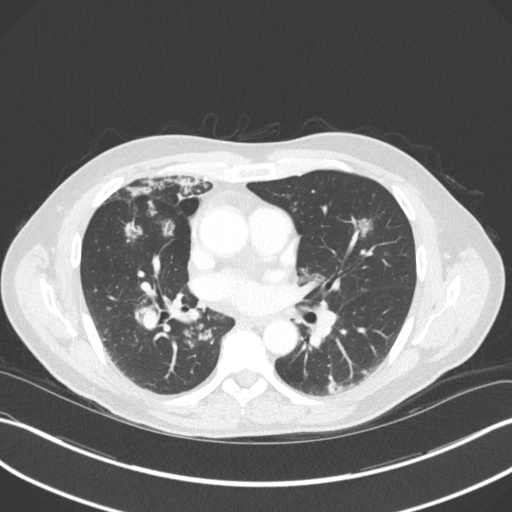
[im 99/157  lung]
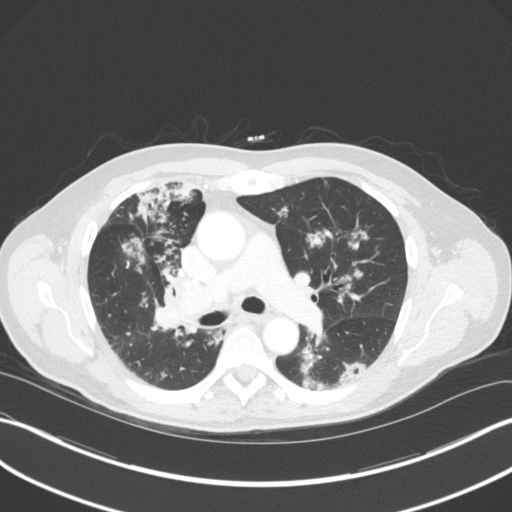
[im 110/157  mediastinal]
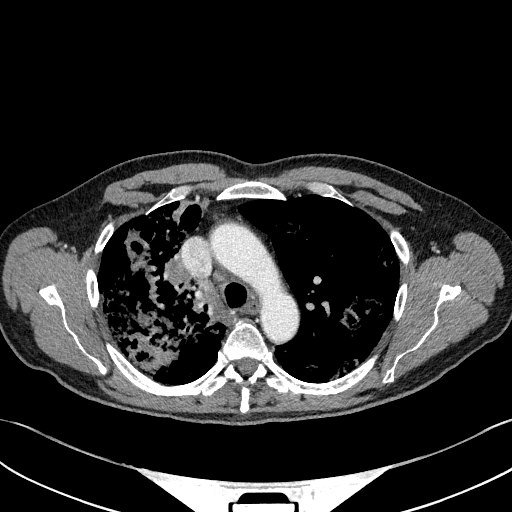
[im 110/157  lung]
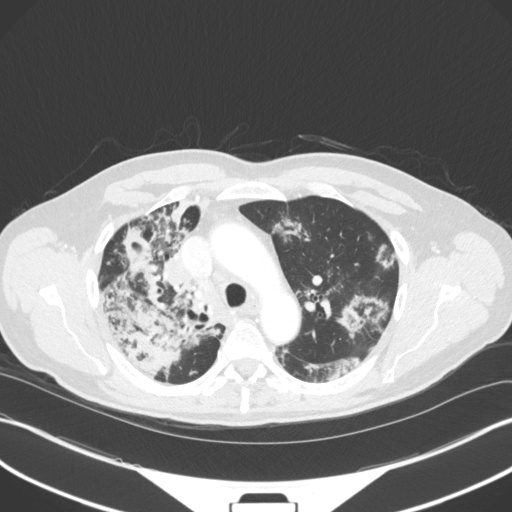
[im 122/157  lung]
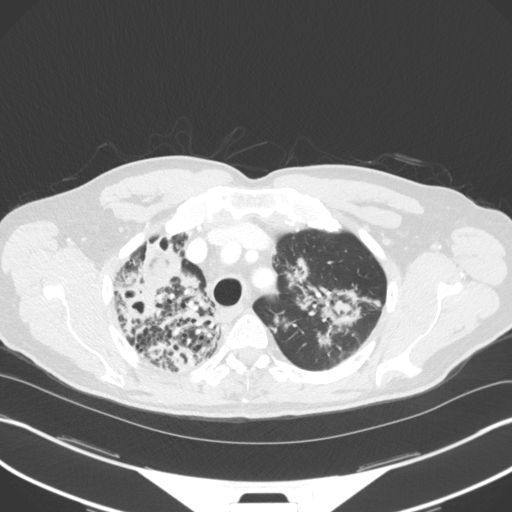
[im 133/157  lung]
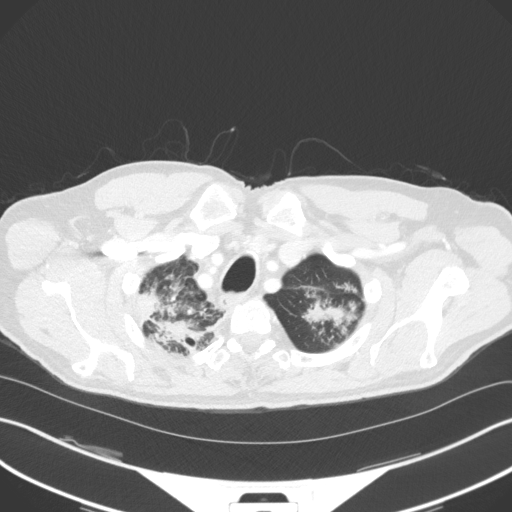
[im 145/157  lung]
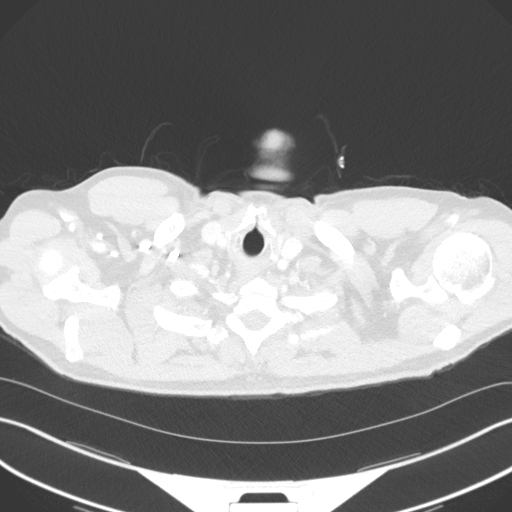

[Series 5: coronal · coronal · 0.64mm/px · 3 of 119 slices shown]
[im 24/119  lung]
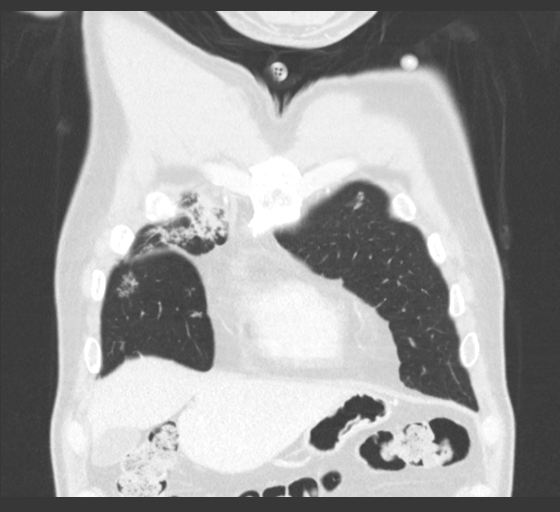
[im 48/119  lung]
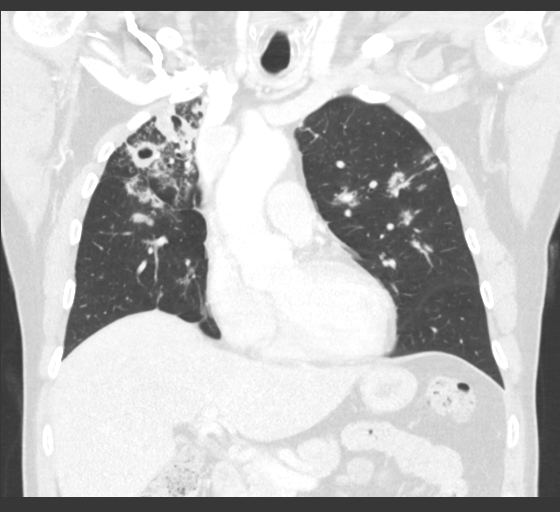
[im 71/119  lung]
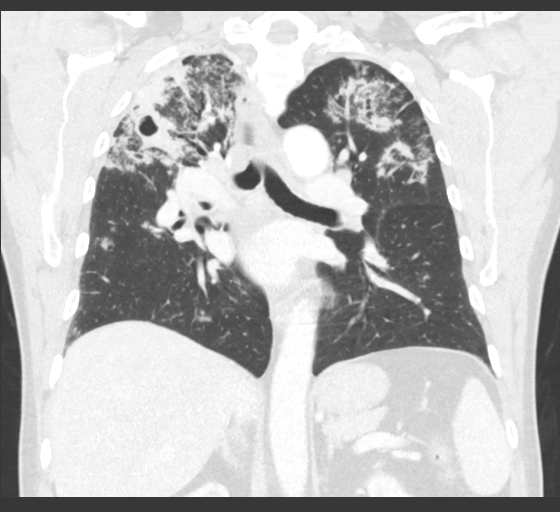

[15 of 36 positions shown; findings below may reference images not displayed]

FINDINGS: Cardiovascular: Heart size is normal. There is no significant
pericardial fluid, thickening or pericardial calcification. Minimal
wall atherosclerotic disease noted in the thoracic aorta.

Mediastinum/Nodes: Multiple borderline enlarged and mildly enlarged
mediastinal and right hilar lymph nodes measuring up to 1 cm in
short axis in the low right paratracheal nodal station. Esophagus is
unremarkable in appearance. No axillary lymphadenopathy.

Lungs/Pleura: There are numerous upper lung predominant areas of
nodularity, the majority of which are predominantly ground-glass
attenuation in appearance. Several larger nodules and masses are
frankly cavitary, particularly in the right upper lobe where there
are numerous large cavitary lesions. There is some associated
cylindrical and varicose bronchiectasis in the upper lobes of the
lungs bilaterally (right greater than left). Extensive volume loss
is noted in the right lung, with upward displacement of the right
middle lobe and hyperexpansion of the right lower lobe related to
severe chronic right upper lobe volume loss. Relative sparing of the
lung bases. No pleural effusions.

Upper Abdomen: Sub cm low-attenuation lesion in the central aspect
of segment 8 of the liver is too small to characterize, but favored
to represent tiny cysts.

Musculoskeletal: There are no aggressive appearing lytic or blastic
lesions noted in the visualized portions of the skeleton.
IMPRESSION: 1. Highly unusual appearance of the thorax which is very concerning
for active pulmonary tuberculosis. Other atypical infections could
have a similar appearance, however, the upper lung predominance of
the findings and the extensive cavitation is highly concerning for
active pulmonary tuberculosis (likely with some chronic disease in
the right upper lobe given the apparent volume loss). Immediate
respiratory isolation and sputum testing is strongly recommended.
These results were called by telephone at the time of interpretation
on 09/21/2015 at [DATE] to Dr. KHODOUR THINI, who verbally
acknowledged these results.

## 2017-07-07 IMAGING — CR DG CHEST 1V
1 series · 1 of 1 positions shown · non-contrast
Comparison: CT chest of 09/21/2015 and chest x-ray of 07/23/2015

CLINICAL DATA: History of active tuberculosis, began treatments
yesterday

EXAM:
CHEST 1 VIEW

[w chest pa]
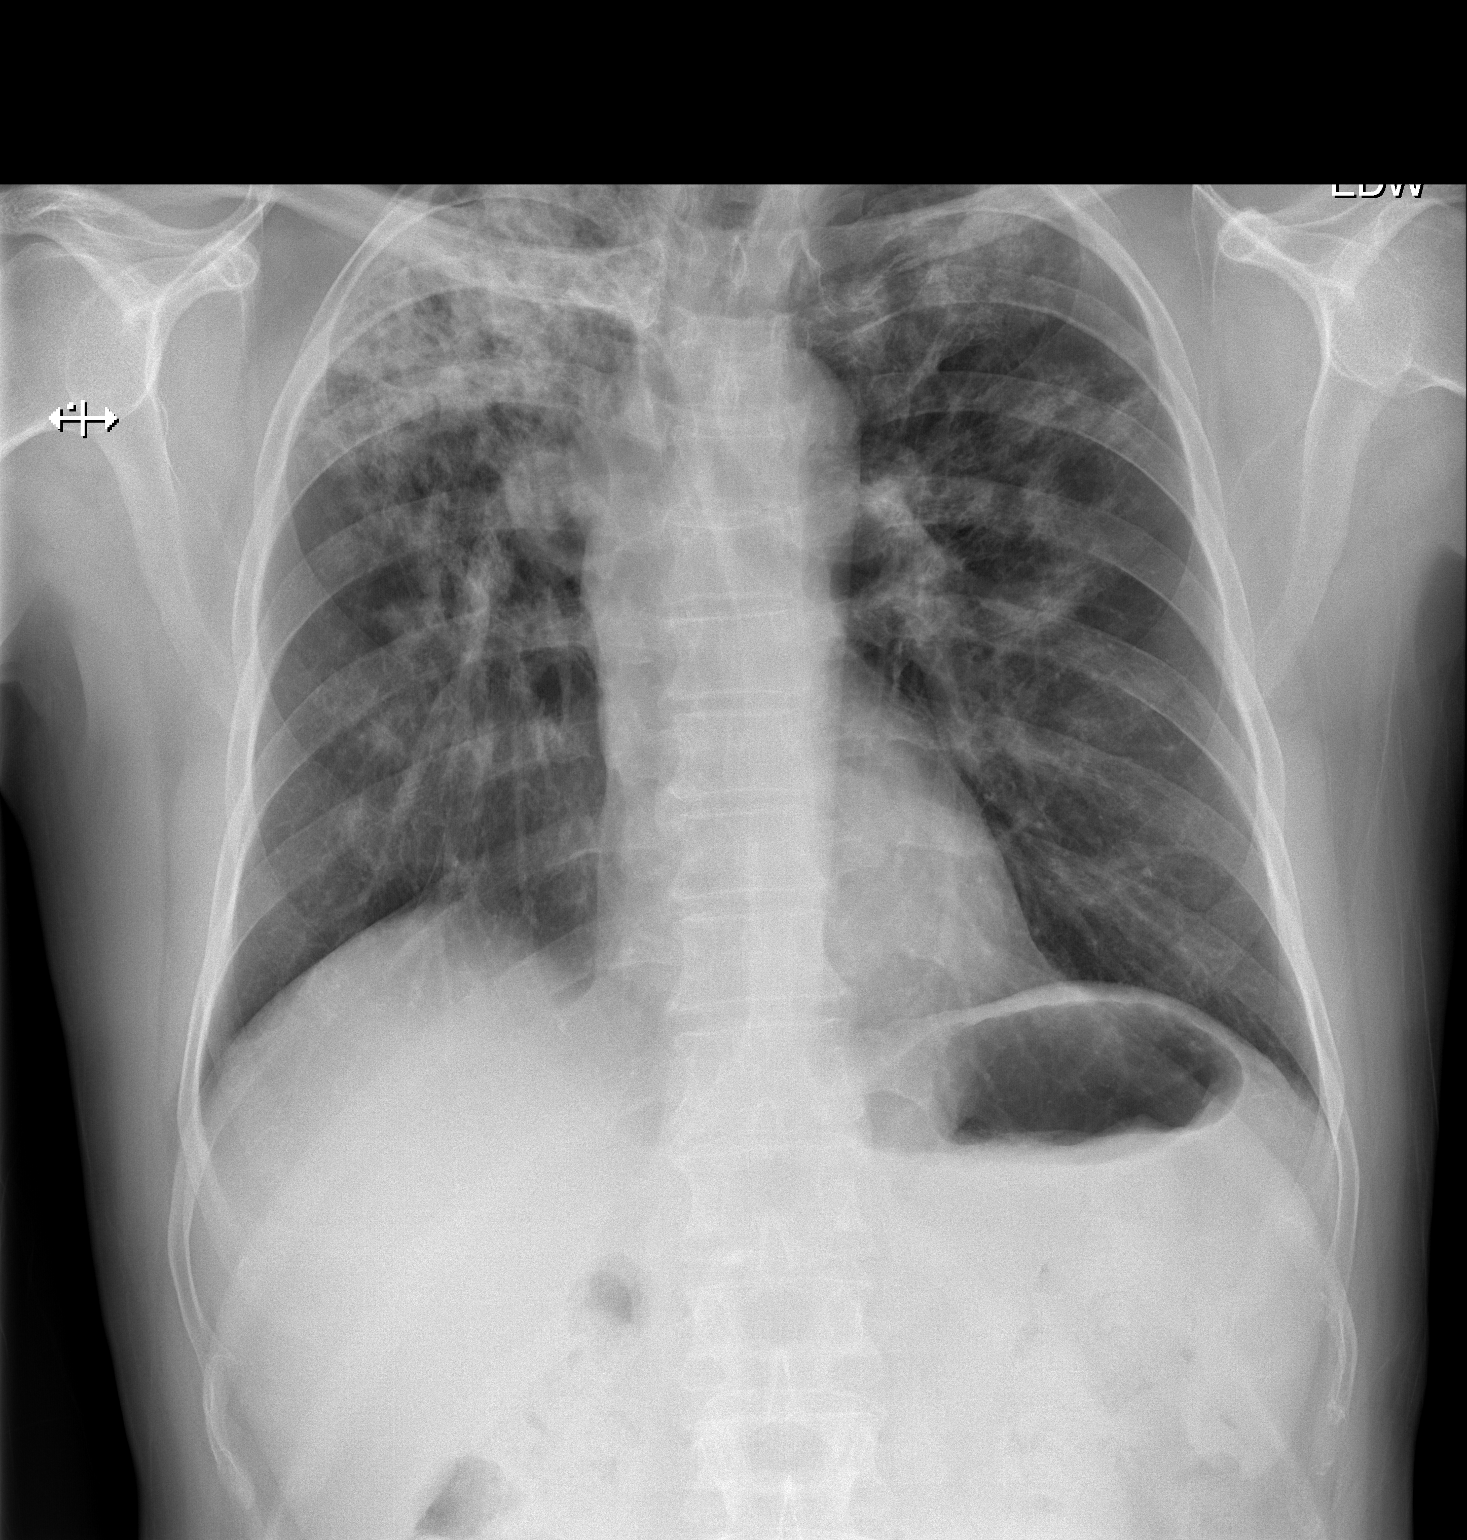

[1 of 1 positions shown; findings below may reference images not displayed]

FINDINGS: There is little change in the U pleural and parenchymal opacity
within the right upper lobe to the apex consistent with active and
chronic disease, consistent with tuberculous infection. Patchy foci
of opacity ulcer remaining of both mid and upper lung feels
consistent with active disease as well. No significant progression
is seen. No pleural effusion is noted. There is some retraction of
the hilum for the right apex suggesting some scarring in the right
upper lobe-apex as well. The heart is within normal limits in size.
No bony abnormality is noted.
IMPRESSION: No change in parenchymal opacities right greater than left
consistent with chronic and active disease most likely tuberculosis.

## 2017-10-02 ENCOUNTER — Other Ambulatory Visit: Payer: Self-pay

## 2017-10-02 ENCOUNTER — Encounter: Payer: Self-pay | Admitting: Emergency Medicine

## 2017-10-02 ENCOUNTER — Ambulatory Visit
Admission: EM | Admit: 2017-10-02 | Discharge: 2017-10-02 | Disposition: A | Discharge status: Home / Self Care | Payer: BC Managed Care – PPO | Attending: Family Medicine | Admitting: Family Medicine

## 2017-10-02 DIAGNOSIS — S39012A Strain of muscle, fascia and tendon of lower back, initial encounter: Secondary | ICD-10-CM | POA: Diagnosis not present

## 2017-10-02 DIAGNOSIS — S29019A Strain of muscle and tendon of unspecified wall of thorax, initial encounter: Secondary | ICD-10-CM

## 2017-10-02 MED ORDER — METAXALONE 800 MG PO TABS: 800.0000 mg | ORAL_TABLET | Freq: Three times a day (TID) | ORAL | 0 refills | Status: AC

## 2017-10-02 MED ORDER — MELOXICAM 15 MG PO TABS: 15.0000 mg | ORAL_TABLET | Freq: Every day | ORAL | 0 refills | Status: AC

## 2017-10-02 NOTE — ED Triage Notes (Signed)
Patient states that he was cleaning a new machine and when he went to put the bottom lid back on and stood up he felt a pull in his lower back.  Patient states that this incident happened at work last week on Tuesday.  Patient states that his lower back has been sore ever since.

## 2017-10-02 NOTE — Discharge Instructions (Signed)
Apply ice 20 minutes out of every 2 hours 4-5 times daily for comfort. Use  Caution while taking muscle relaxers.  Do not perform activities requiring concentration or judgment and do not drive.  °

## 2017-10-02 NOTE — ED Provider Notes (Addendum)
[facility]    CSN: [id number] Arrival date & time: 10/02/17  4098     History   Chief Complaint Chief Complaint  Patient presents with  . Back Pain    HPI TYRIEK HOFMAN is a 62 y.o. male.   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  He states that that time he felt a pull or movement in his lower back.  He was not able to work from day Wednesday through today.  He states that the pain is in his low back with radiation into his buttock and his thighs bilaterally.  He has had no incontinence.  Does complain of mild constipation that he has had.          History reviewed. No pertinent past medical history.  Patient Active Problem List   Diagnosis Date Noted  . Chronic cough 08/10/2015  . Abnormal chest x-ray 08/10/2015  . Opacity of lung on imaging study 08/10/2015    Past Surgical History:  Procedure Laterality Date  . HERNIA REPAIR         Home Medications    Prior to Admission medications   Medication Sig Start Date End Date Taking? Authorizing Provider  aspirin 325 MG tablet Take 325 mg by mouth daily.   Yes [provider]  meloxicam (MOBIC) 15 MG tablet Take 1 tablet (15 mg total) by mouth daily. 10/02/17   Lutricia Feil, PA-C  metaxalone (SKELAXIN) 800 MG tablet Take 1 tablet (800 mg total) by mouth 3 (three) times daily. 10/02/17   Lutricia Feil, PA-C    Family History Family History  Problem Relation Age of Onset  . Heart disease Father   . Lung cancer Father     Social History Social History   Tobacco Use  . Smoking status: Never Smoker  . Smokeless tobacco: Never Used  Substance Use Topics  . Alcohol use: No  . Drug use: No     Allergies   Patient has no known allergies.   Review of Systems Review of Systems  Constitutional: Positive for  activity change. Negative for appetite change, chills, diaphoresis, fatigue and fever.  Musculoskeletal: Positive for back pain and myalgias.  All other systems reviewed and are negative.    Physical Exam Triage Vital Signs ED Triage Vitals  Enc Vitals Group     BP 10/02/17 0850 130/90     Pulse Rate 10/02/17 0850 65     Resp 10/02/17 0850 16     Temp 10/02/17 0850 97.8 F (36.6 C)     Temp Source 10/02/17 0850 Oral     SpO2 10/02/17 0850 97 %     Weight 10/02/17 0848 155 lb (70.3 kg)     Height 10/02/17 0848 5\' 7"  (1.702 m)     Head Circumference --      Peak Flow --      Pain Score 10/02/17 0848 4     Pain Loc --      Pain Edu? --      Excl. in GC? --    No data found.  Updated Vital Signs BP 130/90 (BP Location: Left Arm)   Pulse 65   Temp 97.8 F (36.6 C) (Oral)   Resp 16   Ht 5\' 7"  (1.702 m)   Wt 155 lb (70.3  kg)   SpO2 97%   BMI 24.28 kg/m   Visual Acuity Right Eye Distance:   Left Eye Distance:   Bilateral Distance:    Right Eye Near:   Left Eye Near:    Bilateral Near:     Physical Exam  Constitutional: He is oriented to person, place, and time. He appears well-developed and well-nourished. No distress.  HENT:  Head: Normocephalic.  Eyes: Pupils are equal, round, and reactive to light. Right eye exhibits no discharge. Left eye exhibits no discharge. No scleral icterus.  Neck: Normal range of motion. Neck supple.  Musculoskeletal: Normal range of motion. He exhibits tenderness.  Exam of the thoracic and lumbar spine shows decreased range of motion of the thoracic rotation particularly to the right which causes him mid thoracic pain.  Lumbar exam shows pelvis is level in stance. Able To forward flex to the level of his ankles with his hands.  Returning to upright posture is difficult.  Flexion shows blunting of the lumbar segments towards the left.  He is able to walk his heels and toes.  Deep tendon reflexes are 2+/4 and symmetrical.  Great leg raise  testing is negative at 90 degrees in the seated position bilaterally.  EHL peroneal  and anterior tibialis are strong to clinical testing.  Sensation is intact throughout the lower extremities  Neurological: He is alert and oriented to person, place, and time.  Skin: Skin is warm and dry. He is not diaphoretic.  Psychiatric: He has a normal mood and affect. His behavior is normal. Judgment and thought content normal.  Nursing note and vitals reviewed.    UC Treatments / Results  Labs (all labs ordered are listed, but only abnormal results are displayed) Labs Reviewed - No data to display  EKG None  Radiology No results found.  Procedures Procedures (including critical care time)  Medications Ordered in UC Medications - No data to display  Initial Impression / Assessment and Plan / UC Course  I have reviewed the triage vital signs and the nursing notes.  Pertinent labs & imaging results that were available during my care of the patient were reviewed by me and considered in my medical decision making (see chart for details).     Patient was encouraged that neurologically he remains intact.  This is a musculoskeletal injury.  I have offered him an injection of Toradol which she refused.  I will start him on Mobic 15 mg daily and provided him with Skelaxin for muscle relaxation with appropriate precautions.  He should use ice on the area 20 minutes out of every 2 hours 4-5 times daily.  I have provided him with restrictions for the next 14 days to avoid prolonged sitting standing or bending and no lifting greater than 25 pounds.  I have asked him to reestablish care with his primary care and follow-up with them in the next 2 weeks. Final Clinical Impressions(s) / UC Diagnoses   Final diagnoses:  Strain of lumbar region, initial encounter  Thoracic myofascial strain, initial encounter     Discharge Instructions     Apply ice 20 minutes out of every 2 hours 4-5 times daily for  comfort. Use  Caution while taking muscle relaxers.  Do not perform activities requiring concentration or judgment and do not drive.     ED Prescriptions    Medication Sig Dispense Auth. Provider   metaxalone (SKELAXIN) 800 MG tablet Take 1 tablet (800 mg total) by mouth 3 (three) times daily.  21 tablet Ovid Curdoemer, Shakita Keir P, PA-C   meloxicam (MOBIC) 15 MG tablet Take 1 tablet (15 mg total) by mouth daily. 30 tablet Lutricia Feiloemer, Kingslee Dowse P, PA-C     Controlled Substance Prescriptions [place] Controlled Substance Registry consulted? Not Applicable   Lutricia FeilRoemer, Somara Frymire P, PA-C 10/02/17 0931    Lutricia Feiloemer, Jeanet Lupe P, PA-C 10/02/17 [phone]
